# Patient Record
Sex: Female | Born: 1955 | Hispanic: No | Marital: Married | Smoking: Never smoker
Health system: Southern US, Community
[De-identification: ages and names within clinical notes are randomized; demographics above are authoritative.]

## PROBLEM LIST (undated history)

## (undated) DIAGNOSIS — N870 Mild cervical dysplasia: Secondary | ICD-10-CM

## (undated) DIAGNOSIS — E042 Nontoxic multinodular goiter: Secondary | ICD-10-CM

## (undated) DIAGNOSIS — F419 Anxiety disorder, unspecified: Secondary | ICD-10-CM

## (undated) DIAGNOSIS — E039 Hypothyroidism, unspecified: Secondary | ICD-10-CM

## (undated) HISTORY — DX: Anxiety disorder, unspecified: F41.9

## (undated) HISTORY — DX: Nontoxic multinodular goiter: E04.2

## (undated) HISTORY — DX: Hypothyroidism, unspecified: E03.9

## (undated) HISTORY — PX: COLPOSCOPY: SHX161

## (undated) HISTORY — DX: Mild cervical dysplasia: N87.0

## (undated) HISTORY — PX: TUBAL LIGATION: SHX77

---

## 1992-10-07 HISTORY — PX: CARPAL TUNNEL RELEASE: SHX101

## 2003-10-08 HISTORY — PX: THYROID SURGERY: SHX805

## 2009-03-30 ENCOUNTER — Ambulatory Visit: Payer: Self-pay | Admitting: Obstetrics and Gynecology

## 2009-04-07 ENCOUNTER — Ambulatory Visit: Payer: Self-pay | Admitting: Obstetrics and Gynecology

## 2009-06-28 ENCOUNTER — Ambulatory Visit: Payer: Self-pay | Admitting: Obstetrics and Gynecology

## 2009-09-15 ENCOUNTER — Ambulatory Visit: Payer: Self-pay | Admitting: Obstetrics and Gynecology

## 2009-09-15 ENCOUNTER — Other Ambulatory Visit: Admission: RE | Admit: 2009-09-15 | Discharge: 2009-09-15 | Payer: Self-pay | Admitting: Obstetrics and Gynecology

## 2010-09-19 ENCOUNTER — Other Ambulatory Visit
Admission: RE | Admit: 2010-09-19 | Discharge: 2010-09-19 | Payer: Self-pay | Source: Home / Self Care | Admitting: Obstetrics and Gynecology

## 2010-09-19 ENCOUNTER — Ambulatory Visit: Payer: Self-pay | Admitting: Obstetrics and Gynecology

## 2011-03-21 ENCOUNTER — Ambulatory Visit (INDEPENDENT_AMBULATORY_CARE_PROVIDER_SITE_OTHER): Payer: BC Managed Care – PPO | Admitting: Obstetrics and Gynecology

## 2011-03-21 DIAGNOSIS — N951 Menopausal and female climacteric states: Secondary | ICD-10-CM

## 2011-03-21 DIAGNOSIS — N920 Excessive and frequent menstruation with regular cycle: Secondary | ICD-10-CM

## 2011-05-13 ENCOUNTER — Telehealth: Payer: Self-pay

## 2011-05-13 DIAGNOSIS — Z780 Asymptomatic menopausal state: Secondary | ICD-10-CM

## 2011-05-13 MED ORDER — DROSPIRENONE-ESTRADIOL 0.5-1 MG PO TABS: 1.0000 | ORAL_TABLET | Freq: Every day | ORAL | Status: DC

## 2011-05-13 NOTE — Telephone Encounter (Signed)
The patient called back today. Prior to initiating HRT she was having mild abdominal bloating. She has now been on Enjuvia and the abdominal bloating has gotten much worse. We lowered the dose of Enjuvia and it's not any better. She is also having vaginal dryness. She was on estrogen cream the report for the epic she stopped it when she started the systemic HRT. She had read about Angeliq did she want to try that. We have already discussed that menopause can cause some weight gain which is another issue for her. We'll we decided to do today was switch her to angelic 1 mg. She knows to stop her progesterone since she is already on progesterone with angelic. If the vaginal dryness persists she'll add back her estrogen cream. I have also asked her to schedule pelvic ultrasound. Her medication was called in.

## 2011-05-13 NOTE — Telephone Encounter (Signed)
DR. Eda Paschal LEFT MESSAGE TO TALK TO PT. PT CALLED BACK & I GOTHER ON THE PHONE FOR HIM.

## 2011-05-13 NOTE — Telephone Encounter (Signed)
SEE NURSE NOTE IN CHART ON 04/25/11. PT. C-O OF SAME SYMPTOMS ON LOWER DOSE OF ENJUVIA 0.9. HAS HEARD ABOUT AN RX ANGELIQ & IS INTERESTED IN IT IF YOU THINK IT IS A GOOD ONE TO TRY. SHE IS ALSO OK WITH YOU SUGGESTING A DIFFERENT RX IF THERE IS ONE BETTER TO TRY INSTEAD OF THE ANGELIQ.

## 2011-05-14 ENCOUNTER — Encounter: Payer: Self-pay | Admitting: Obstetrics and Gynecology

## 2011-06-11 ENCOUNTER — Ambulatory Visit (INDEPENDENT_AMBULATORY_CARE_PROVIDER_SITE_OTHER): Payer: BC Managed Care – PPO | Admitting: Obstetrics and Gynecology

## 2011-06-11 ENCOUNTER — Other Ambulatory Visit: Payer: Self-pay

## 2011-06-11 ENCOUNTER — Other Ambulatory Visit: Payer: BC Managed Care – PPO

## 2011-06-11 DIAGNOSIS — D251 Intramural leiomyoma of uterus: Secondary | ICD-10-CM

## 2011-06-11 DIAGNOSIS — D259 Leiomyoma of uterus, unspecified: Secondary | ICD-10-CM

## 2011-06-11 DIAGNOSIS — R141 Gas pain: Secondary | ICD-10-CM

## 2011-06-11 DIAGNOSIS — R143 Flatulence: Secondary | ICD-10-CM

## 2011-06-11 DIAGNOSIS — N83339 Acquired atrophy of ovary and fallopian tube, unspecified side: Secondary | ICD-10-CM

## 2011-06-11 DIAGNOSIS — Z780 Asymptomatic menopausal state: Secondary | ICD-10-CM

## 2011-06-11 DIAGNOSIS — N951 Menopausal and female climacteric states: Secondary | ICD-10-CM

## 2011-06-11 NOTE — Progress Notes (Signed)
The patient came in today for a ultrasound because of abdominal bloating. 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 She has several small myomas of less than 2 cm. Her endometrial echo is 5.8 mm consistent with her HRT. Both her ovaries are atrophic. Her cul-de-sac is free of fluid. She says she's doing just as well on Angeliq as she did on Enjuvia with less fluid retention. I have explained her that Angeliq's progestin is a diuretic and that is probably why. She was reassured about the ultrasound. She will continue on Angeliq. She wonders a summary of the side effects she is having is from her Ambien. I switched today Lunesta 3 mg #30 with 2 refills. We discussed referral to a sleep specialist if this doesn't work.

## 2011-07-05 ENCOUNTER — Telehealth: Payer: Self-pay | Admitting: *Deleted

## 2011-07-05 NOTE — Telephone Encounter (Signed)
Pt wanted a referral to Dr Vickey Huger for her sleep issues. She states you and her had talked a little about it at her last visit. You gave her Alfonso Patten which is helping a little bit but is expensive, so I sent her a coupon. She wants to proceed with seeing Dr Vickey Huger. Patient knows Dr Joanna Hews here and we will get back to her when he returns. PLease advise.

## 2011-07-06 NOTE — Telephone Encounter (Signed)
Please refer to Dr. Vickey Huger

## 2011-07-08 NOTE — Telephone Encounter (Signed)
Pt knows will hear from our referral coordinator

## 2011-07-11 ENCOUNTER — Encounter: Payer: Self-pay | Admitting: Obstetrics and Gynecology

## 2011-08-05 ENCOUNTER — Encounter: Payer: Self-pay | Admitting: Obstetrics and Gynecology

## 2011-09-04 ENCOUNTER — Other Ambulatory Visit: Payer: Self-pay | Admitting: Endocrinology

## 2011-09-04 DIAGNOSIS — E049 Nontoxic goiter, unspecified: Secondary | ICD-10-CM

## 2011-09-10 ENCOUNTER — Other Ambulatory Visit: Payer: BC Managed Care – PPO

## 2011-09-10 ENCOUNTER — Other Ambulatory Visit: Payer: Self-pay | Admitting: Obstetrics and Gynecology

## 2011-09-10 NOTE — Telephone Encounter (Signed)
Patient has RGCE scheduled 09/25/11.

## 2011-09-25 ENCOUNTER — Other Ambulatory Visit (HOSPITAL_COMMUNITY)
Admission: RE | Admit: 2011-09-25 | Discharge: 2011-09-25 | Disposition: A | Discharge status: Home / Self Care | Payer: BC Managed Care – PPO | Source: Ambulatory Visit | Attending: Obstetrics and Gynecology | Admitting: Obstetrics and Gynecology

## 2011-09-25 ENCOUNTER — Encounter: Payer: Self-pay | Admitting: Obstetrics and Gynecology

## 2011-09-25 ENCOUNTER — Ambulatory Visit (INDEPENDENT_AMBULATORY_CARE_PROVIDER_SITE_OTHER): Payer: BC Managed Care – PPO | Admitting: Obstetrics and Gynecology

## 2011-09-25 VITALS — BP 124/76 | Ht 61.0 in | Wt 120.0 lb

## 2011-09-25 DIAGNOSIS — Z780 Asymptomatic menopausal state: Secondary | ICD-10-CM

## 2011-09-25 DIAGNOSIS — Z01419 Encounter for gynecological examination (general) (routine) without abnormal findings: Secondary | ICD-10-CM | POA: Insufficient documentation

## 2011-09-25 DIAGNOSIS — N951 Menopausal and female climacteric states: Secondary | ICD-10-CM

## 2011-09-25 DIAGNOSIS — N870 Mild cervical dysplasia: Secondary | ICD-10-CM | POA: Insufficient documentation

## 2011-09-25 DIAGNOSIS — R823 Hemoglobinuria: Secondary | ICD-10-CM

## 2011-09-25 MED ORDER — DROSPIRENONE-ESTRADIOL 0.5-1 MG PO TABS: 1.0000 | ORAL_TABLET | Freq: Every day | ORAL | Status: AC

## 2011-09-25 MED ORDER — ESZOPICLONE 3 MG PO TABS: 3.0000 mg | ORAL_TABLET | Freq: Every day | ORAL | Status: DC

## 2011-09-25 NOTE — Progress Notes (Signed)
Patient came to see me today for her annual GYN exam. She is currently on Angelique with complete control of her vasomotor symptoms. She does say that she gets agitated for several hours after taking it. She takes it at dinnertime and has some trouble fallen asleep. She is now on [place] rather than [place]  which she likes better. She is having no bleeding or pelvic pain. She is up-to-date on mammograms and bone densities. She does her lab work elsewhere.  Physical examination:  Kennon Portela present. HEENT within normal limits. Neck: Thyroid not large. No masses. Supraclavicular nodes: not enlarged. Breasts: Examined in both sitting midline position. No skin changes and no masses. Abdomen: Soft no guarding rebound or masses or hernia. Pelvic: External: Within normal limits. BUS: Within normal limits. Vaginal:within normal limits. Good estrogen effect. No evidence of cystocele rectocele or enterocele. Cervix: clean. Uterus: Normal size and shape. Adnexa: No masses. Rectovaginal exam: Confirmatory and negative. Extremities: Within normal limits.  Assessment: Menopausal symptoms  Plan: For the moment continue Angelique but take it in the morning. Also discussed CombiPatch for [place] pro which she may want to try based on her response to the above plus the cost. She will inform. She will continue yearly mammograms. We refilled her Alfonso Patten they as well.

## 2011-09-27 ENCOUNTER — Other Ambulatory Visit: Payer: Self-pay | Admitting: *Deleted

## 2011-10-03 ENCOUNTER — Encounter: Payer: BC Managed Care – PPO | Admitting: Obstetrics and Gynecology

## 2011-10-09 ENCOUNTER — Telehealth: Payer: Self-pay | Admitting: *Deleted

## 2011-10-09 NOTE — Telephone Encounter (Signed)
Pt is complaining of spotting since December 18th, pt had annual on dec.19 and spoke with you about this. On December 25 th bleeding became more heavy along with cramping. She take HRT Angelique 0.5-1 mg tablet daily changes pads every couple of every hours. Pt takes ibuprofen for cramping. She wants to know if the HRT could cause bleeding? Please advise

## 2011-10-09 NOTE — Telephone Encounter (Signed)
I think the bleeding is due to her HRT. However I want to be sure so I would like her to come in for endometrial biopsy.

## 2011-10-09 NOTE — Telephone Encounter (Signed)
Patient informed. appts to schedule.

## 2011-10-10 ENCOUNTER — Encounter: Payer: Self-pay | Admitting: Obstetrics and Gynecology

## 2011-10-10 ENCOUNTER — Ambulatory Visit (INDEPENDENT_AMBULATORY_CARE_PROVIDER_SITE_OTHER): Payer: BC Managed Care – PPO | Admitting: Obstetrics and Gynecology

## 2011-10-10 VITALS — BP 150/90

## 2011-10-10 DIAGNOSIS — N950 Postmenopausal bleeding: Secondary | ICD-10-CM

## 2011-10-10 NOTE — Progress Notes (Signed)
Patient came to see me today because of persistent postmenopausal bleeding. We had switched her to Angelique in August. She had no bleeding until mid-December. It started spotting but over the past several weeks it's gotten heavier. We have done a pelvic ultrasound on her in September, 2012 for pelvic pain. Her endometrial echo then was 5.8 mm. She had several small fibroids without any ovarian pathology.  Pelvic exam: Kennon Portela present. External within normal limits. Vaginal exam within normal limits. Cervix clean with small amount of blood coming from os. Uterus normal size and shape. Adnexa within normal limits. Patient had 2 areas of folliculitis on the top of her mons.  Assessment: Postmenopausal bleeding on HRT.  Plan: Endometrial biopsy done. We will call patient with report. She will continue Angelique. If bleeding persists for another month we will proceed with SI[facility]. Patient to use warm compresses to area of folliculitis. They did not appear infected today.

## 2011-10-14 ENCOUNTER — Other Ambulatory Visit: Payer: Self-pay | Admitting: Endocrinology

## 2011-10-14 DIAGNOSIS — E041 Nontoxic single thyroid nodule: Secondary | ICD-10-CM

## 2011-10-16 ENCOUNTER — Encounter: Payer: BC Managed Care – PPO | Admitting: Obstetrics and Gynecology

## 2011-10-17 ENCOUNTER — Other Ambulatory Visit: Payer: BC Managed Care – PPO

## 2011-10-23 ENCOUNTER — Ambulatory Visit
Admission: RE | Admit: 2011-10-23 | Discharge: 2011-10-23 | Disposition: A | Discharge status: Home / Self Care | Payer: BC Managed Care – PPO | Source: Ambulatory Visit | Attending: Endocrinology | Admitting: Endocrinology

## 2011-10-23 DIAGNOSIS — E049 Nontoxic goiter, unspecified: Secondary | ICD-10-CM

## 2011-11-05 ENCOUNTER — Other Ambulatory Visit: Payer: Self-pay | Admitting: Endocrinology

## 2011-11-05 DIAGNOSIS — E041 Nontoxic single thyroid nodule: Secondary | ICD-10-CM

## 2011-12-06 ENCOUNTER — Telehealth: Payer: Self-pay | Admitting: *Deleted

## 2011-12-06 NOTE — Telephone Encounter (Signed)
Schedule saline infusion hystogram for me to do next week in office. Explain to pt what procedure is.

## 2011-12-06 NOTE — Telephone Encounter (Signed)
(  Pt is aware you are out of office) pt is currently on Angelique 0.5-1 mg pt was told to follow up with you if bleeding should occur again. Pt started having bleeding on wed. 12/04/11 not heavy, but enough to wear mini pad per pt. It is red blood, not brown she also noted having some cramping with bleeding as well. Please advise

## 2011-12-09 ENCOUNTER — Other Ambulatory Visit: Payer: Self-pay | Admitting: *Deleted

## 2011-12-09 DIAGNOSIS — N926 Irregular menstruation, unspecified: Secondary | ICD-10-CM

## 2011-12-09 NOTE — Telephone Encounter (Signed)
Left message on pt vm to call

## 2011-12-09 NOTE — Telephone Encounter (Signed)
Pt informed with the below note, pt will make appointment. 

## 2012-01-08 ENCOUNTER — Ambulatory Visit (INDEPENDENT_AMBULATORY_CARE_PROVIDER_SITE_OTHER): Payer: BC Managed Care – PPO

## 2012-01-08 ENCOUNTER — Ambulatory Visit (INDEPENDENT_AMBULATORY_CARE_PROVIDER_SITE_OTHER): Payer: BC Managed Care – PPO | Admitting: Obstetrics and Gynecology

## 2012-01-08 ENCOUNTER — Other Ambulatory Visit: Payer: Self-pay | Admitting: Obstetrics and Gynecology

## 2012-01-08 DIAGNOSIS — N950 Postmenopausal bleeding: Secondary | ICD-10-CM

## 2012-01-08 DIAGNOSIS — D259 Leiomyoma of uterus, unspecified: Secondary | ICD-10-CM

## 2012-01-08 DIAGNOSIS — N926 Irregular menstruation, unspecified: Secondary | ICD-10-CM

## 2012-01-08 DIAGNOSIS — D251 Intramural leiomyoma of uterus: Secondary | ICD-10-CM

## 2012-01-08 DIAGNOSIS — N83339 Acquired atrophy of ovary and fallopian tube, unspecified side: Secondary | ICD-10-CM

## 2012-01-08 DIAGNOSIS — D219 Benign neoplasm of connective and other soft tissue, unspecified: Secondary | ICD-10-CM

## 2012-01-08 NOTE — Progress Notes (Signed)
Patient came back today for a saline infusion histogram. She is having an appropriate bleeding on Angelique. She is very happy with Angelique except for the bleeding. It can be as much as 10-11 days a month. She had a normal endometrial biopsy when I last saw her. On ultrasound today she has a uterus with 2 small fibroids. One encroaches on the endometrial cavity. Her endometrial echo is 5.5 mm. Her right and left ovary are normal and atrophic. Her cul-de-sac is free of fluid. A catheter was placed in the uterus. Saline was introduced. Patient has a normal endometrial cavity.  Assessment: Inappropriate postmenopausal bleeding  Plan: We had a very long discussion about what the next step should be. For the moment she will do Angelique. She will do it the first 25 days of  the month. She knows to expect withdrawal bleeding. Other options discussed with her her endometrial ablation, Mirena IUD, or cyclical progesterone. She will keep me informed.

## 2012-02-19 ENCOUNTER — Telehealth: Payer: Self-pay | Admitting: *Deleted

## 2012-02-19 NOTE — Telephone Encounter (Signed)
Pt is calling to follow up regarding last OV 01/08/12 pt took her Angelique first 25 days of the month and started taking angelique again on  1st of may as directed. Pt said on may 5 th she had spotting that continued thru Saturday and then heavy bleeding has continued. She is changing pads every 2 hours and having cramping and heavy bleeding, pt said yesterday she felt horrible, this will be her first time taking angelique for the first 25 days only. Pt would like to know what she should do? Please advise

## 2012-02-19 NOTE — Telephone Encounter (Signed)
I called the patient and told her to stop her Angelique. I told her that would stop the bleeding. We discussed other options. They include a Mirena IUD, endometrial ablation, or trying a different product such as Activella or Prempro. She will decide. If she becomes symptomatic will she is deciding we will give her estrogen alone for short period of time.

## 2012-02-27 ENCOUNTER — Ambulatory Visit (INDEPENDENT_AMBULATORY_CARE_PROVIDER_SITE_OTHER): Payer: BC Managed Care – PPO | Admitting: Obstetrics and Gynecology

## 2012-02-27 DIAGNOSIS — Z780 Asymptomatic menopausal state: Secondary | ICD-10-CM

## 2012-02-27 DIAGNOSIS — N951 Menopausal and female climacteric states: Secondary | ICD-10-CM

## 2012-02-27 DIAGNOSIS — N950 Postmenopausal bleeding: Secondary | ICD-10-CM

## 2012-02-27 NOTE — Progress Notes (Signed)
Patient came back today to discuss options for hormone replacement therapy due  to her postmenopausal bleeding. We have assessed the bleeding with saline infusion histogram a normal endometrial biopsy. She does have 2 small intramural fibroids. When we talked last week we suggested she stop the Angelique and she has stopped bleeding now. So far she is asymptomatic. We obviously will not reinitiate treatment unless she becomes symptomatic although I suspect she will. We have previously discussed a Mirena IUD or endometrial ablation but for the moment she would like to not do either. We discussed switching to a different combinations product such as Prempro or Activella. She had previously tried Activella with Dr. Nicholas Lose and experienced significant weight gain. We also discussed and estrogen patch with Prometrium. We discussed at the lowest dose of estrogen that would relieve her symptoms would be the most likely to prevent bleeding. We lastly discussed every 3 months progestin or unopposed estrogen with endometrial biopsies. We discussed all the above over 30 minutes. She will let me know if she needs to reinitiate treatment. We were leaning towards a very low dose Vivelle dot patch of either 0.025  Or .0375 along with continuous Prometrium. We discussed that she does not need systemic estrogen she will probably need vaginal estrogen for intercourse.

## 2012-04-07 ENCOUNTER — Other Ambulatory Visit: Payer: Self-pay | Admitting: Obstetrics and Gynecology

## 2012-04-08 NOTE — Telephone Encounter (Signed)
rx called in

## 2012-04-13 ENCOUNTER — Other Ambulatory Visit: Payer: BC Managed Care – PPO

## 2012-04-16 ENCOUNTER — Telehealth: Payer: Self-pay | Admitting: *Deleted

## 2012-04-16 NOTE — Telephone Encounter (Signed)
Vagifem 10 MCG suppositories. 1 at bedtime for 2 weeks and vagina and then use twice a week.

## 2012-04-16 NOTE — Telephone Encounter (Signed)
Pt calling requesting medication for vaginal dryness, she was told to follow up when ready to proceed with this. Pt said she has used cream sometime ago and it was messy. Please advise

## 2012-04-17 MED ORDER — ESTRADIOL 10 MCG VA TABS: ORAL_TABLET | VAGINAL | Status: AC

## 2012-04-17 NOTE — Telephone Encounter (Signed)
Pt informed with the below note, order placed. 

## 2012-04-17 NOTE — Addendum Note (Signed)
Addended by: Aura Camps on: 04/17/2012 08:35 AM   Modules accepted: Orders

## 2012-05-22 ENCOUNTER — Encounter: Payer: Self-pay | Admitting: Obstetrics and Gynecology

## 2012-05-26 ENCOUNTER — Telehealth: Payer: Self-pay | Admitting: *Deleted

## 2012-05-26 NOTE — Telephone Encounter (Signed)
[facility] request additional views from pt mammogram report. Diag.mammogram of left breast and ultrasound if need of left. Please sign order and I will fax to them.

## 2012-05-26 NOTE — Telephone Encounter (Signed)
Order signed by Dr.G and will be faxed to [facility] @ [phone] office # [phone]

## 2012-05-27 ENCOUNTER — Encounter: Payer: Self-pay | Admitting: Obstetrics and Gynecology

## 2012-06-10 ENCOUNTER — Other Ambulatory Visit: Payer: Self-pay | Admitting: Obstetrics and Gynecology

## 2012-06-15 ENCOUNTER — Other Ambulatory Visit: Payer: Self-pay | Admitting: Obstetrics and Gynecology

## 2012-06-16 ENCOUNTER — Other Ambulatory Visit: Payer: Self-pay | Admitting: Obstetrics and Gynecology

## 2012-06-16 NOTE — Telephone Encounter (Signed)
Dr Reece Agar did a refill on 9/4 on this RX but it didn't reach the pharmacy. It printed. Therefore I called the RX into her pharmacy today. KW

## 2012-06-17 ENCOUNTER — Telehealth: Payer: Self-pay | Admitting: *Deleted

## 2012-06-17 NOTE — Telephone Encounter (Signed)
rx called in

## 2012-06-17 NOTE — Telephone Encounter (Signed)
Error

## 2012-12-15 ENCOUNTER — Telehealth: Payer: Self-pay | Admitting: *Deleted

## 2012-12-15 NOTE — Telephone Encounter (Signed)
Order faxed for 6 month follow up for diag. Mammogram and ultrasound of left breast. Faxed to [facility] in [place] [phone]

## 2012-12-31 ENCOUNTER — Encounter: Payer: Self-pay | Admitting: Gynecology

## 2013-07-17 IMAGING — US US SOFT TISSUE HEAD/NECK
1 series · 14 of 25 positions shown · non-contrast
Comparison: None.

CLINICAL DATA: History of goiter and thyroid nodules.  History of
thyroid cyst removal in 5558.

THYROID ULTRASOUND
TECHNIQUE: Ultrasound examination of the thyroid gland and adjacent
soft tissues was performed.

[Series 1: us soft tissue head/neck · 0.08mm/px · 14 of 60 slices shown]
[im 1/60]
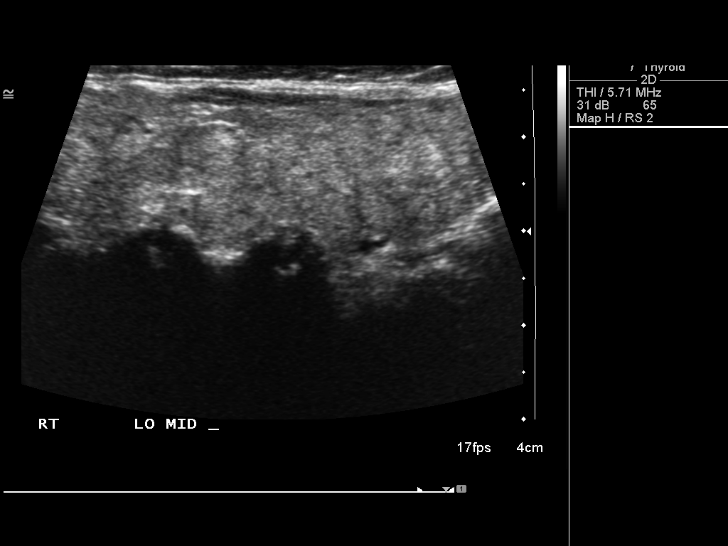
[im 5/60]
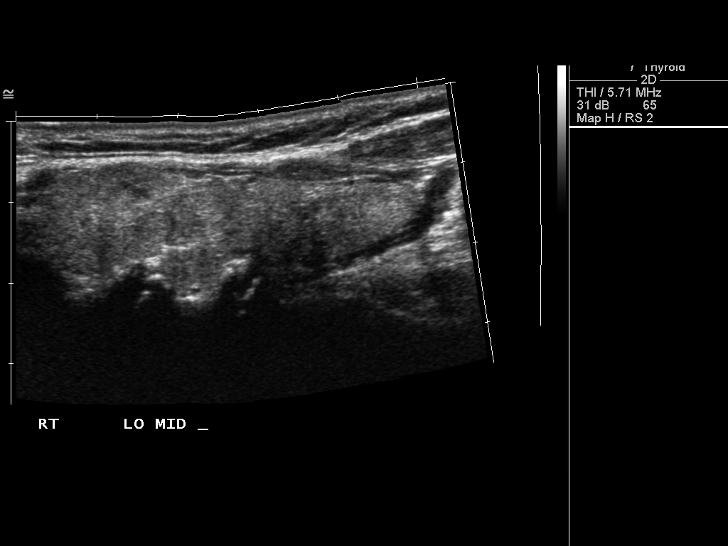
[im 10/60]
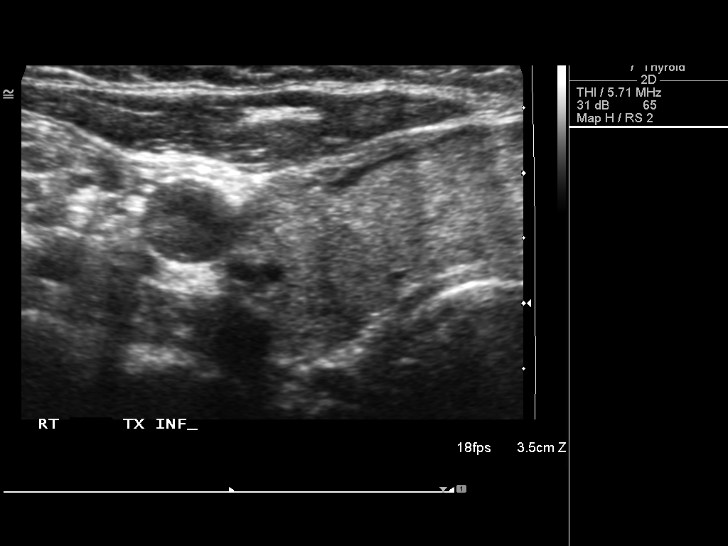
[im 15/60]
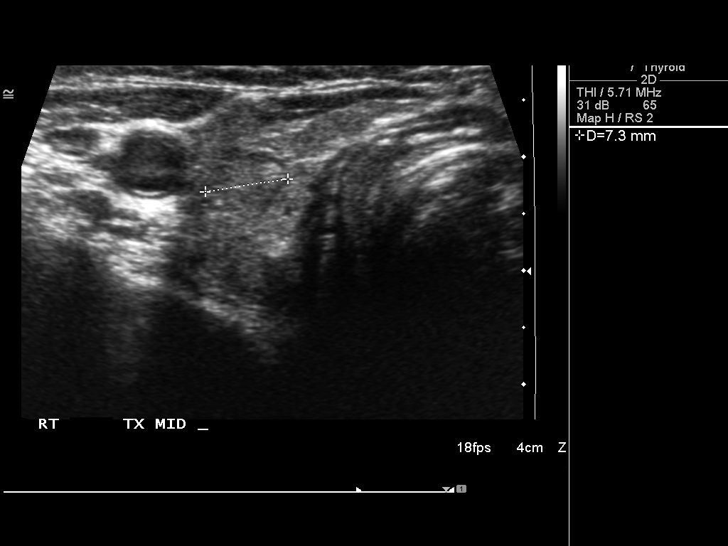
[im 20/60]
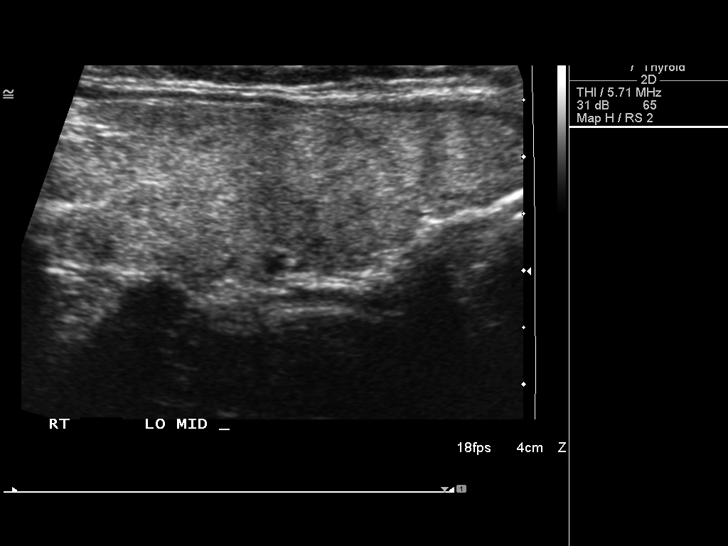
[im 23/60]
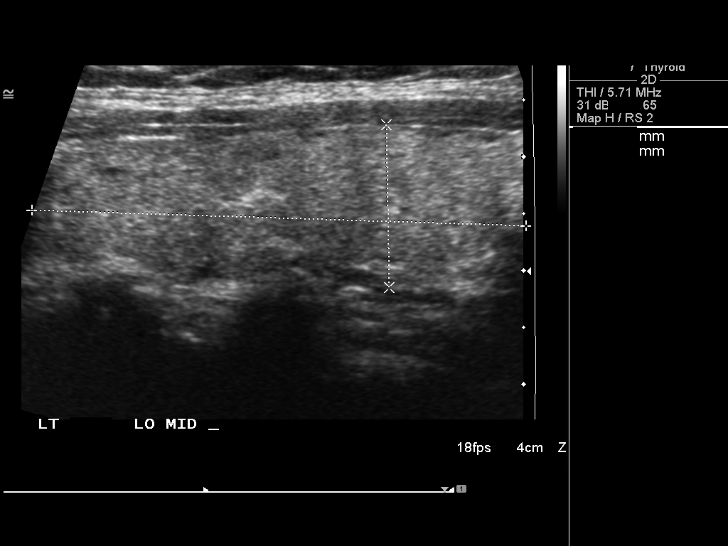
[im 28/60]
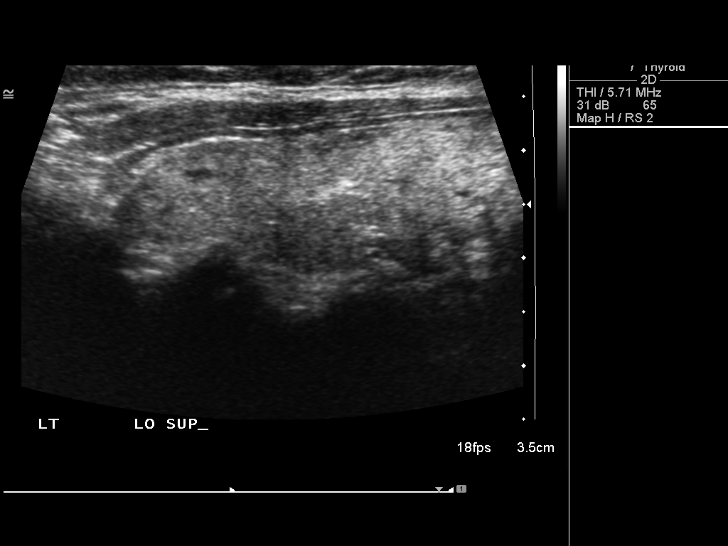
[im 32/60]
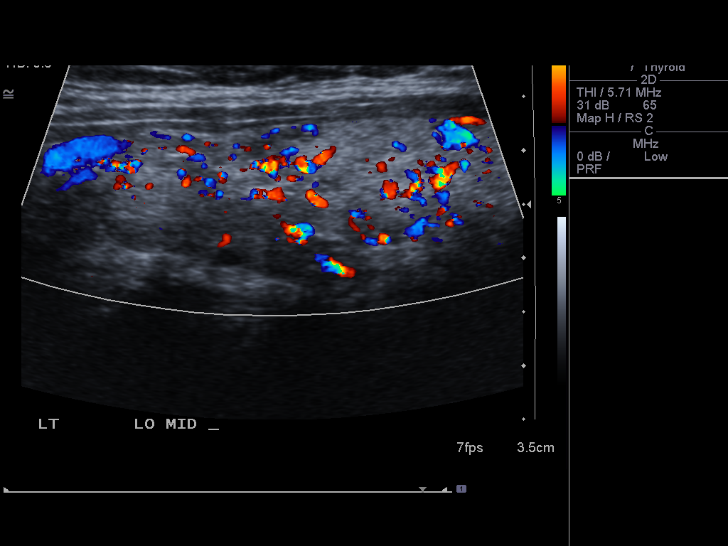
[im 37/60]
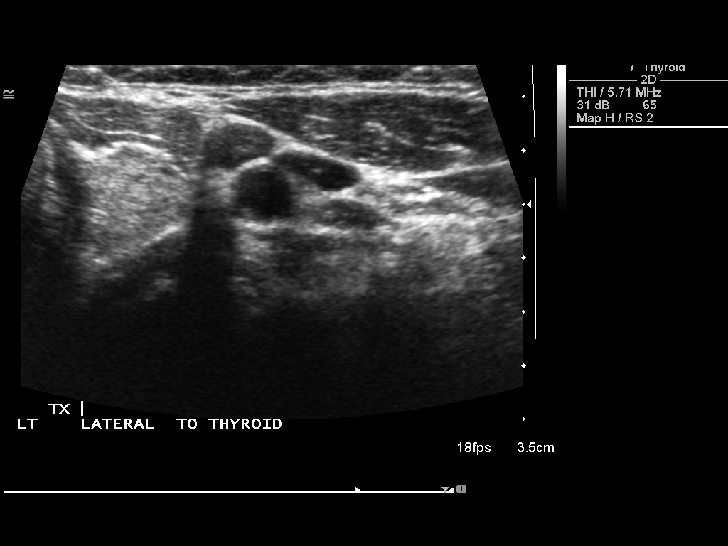
[im 40/60]
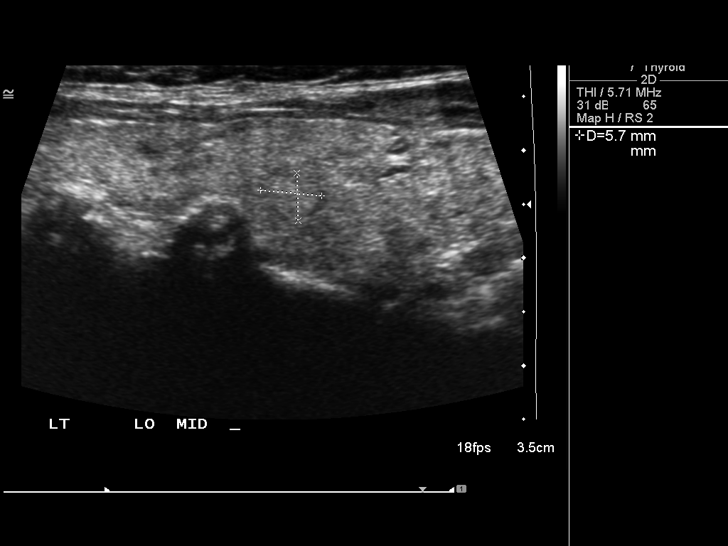
[im 45/60]
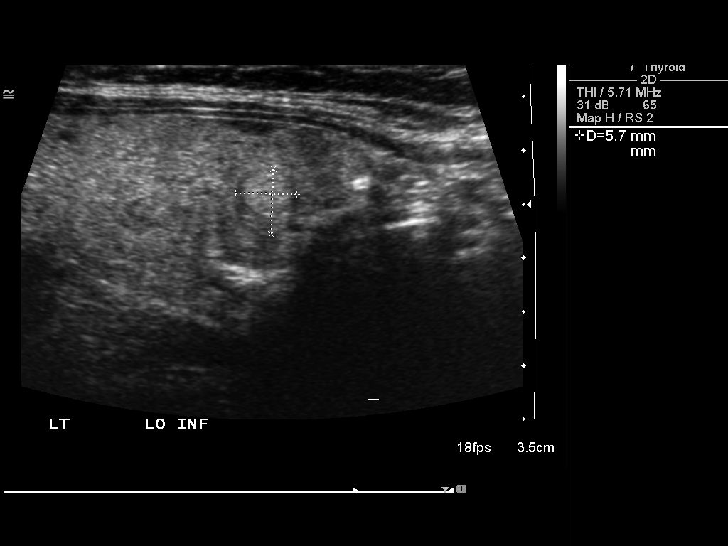
[im 50/60]
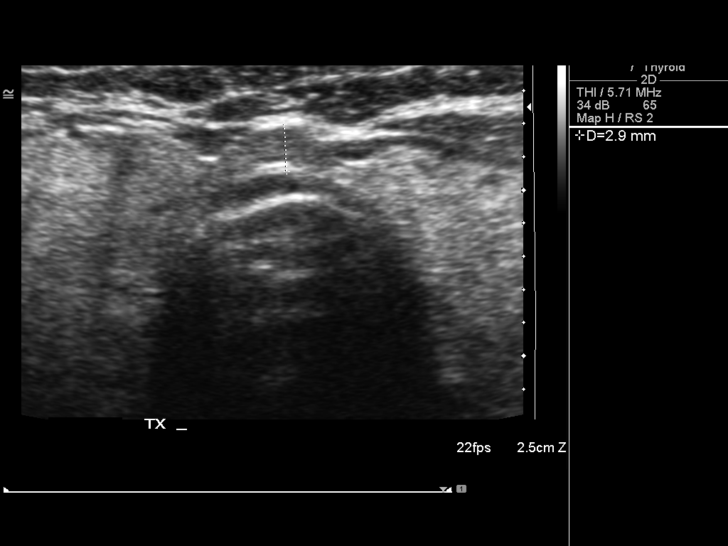
[im 55/60]
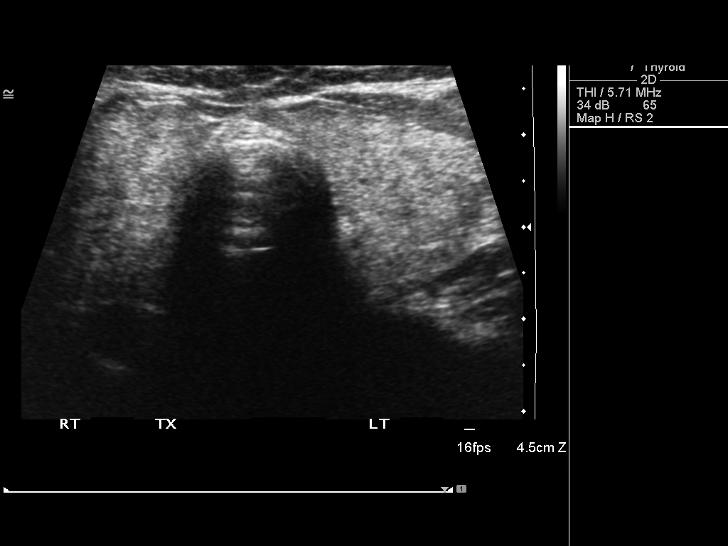
[im 60/60]
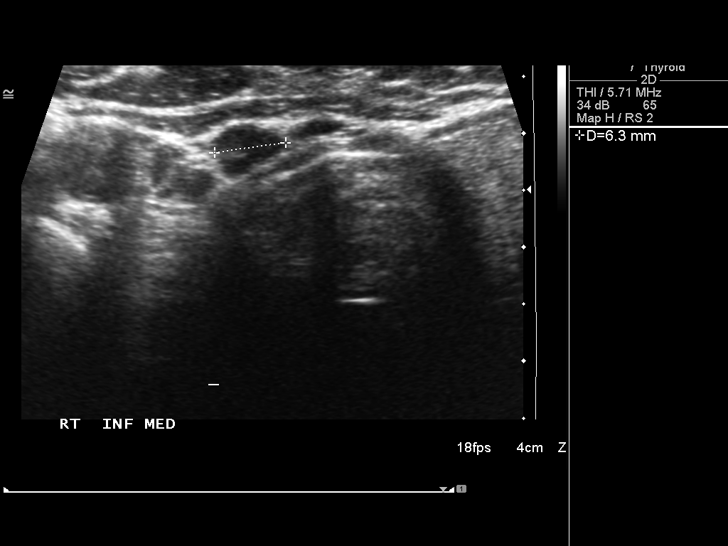

[14 of 25 positions shown; findings below may reference images not displayed]

FINDINGS: Right thyroid lobe:  4.9 x 1.7 x 1.8 cm.
Left thyroid lobe:  5.2 x 1.6 x 1.9 cm.
Isthmus:  0.3 cm in thickness.

Focal nodules:  The gland is diffusely heterogeneous in echotexture
and mildly nodular in contour.  Scattered small nodules are present
bilaterally.  The largest nodule on the right is located in the mid
lobe and measures 13 x 6 x 7 mm.  There are two mildly echogenic
lesions in the left lobe, each measuring 6 mm maximally.  No
dominant or otherwise suspicious nodule is seen.  Just inferior to
the right lobe is a 6 mm hypoechoic nodule which may reflect a
normal-sized lymph node.

Lymphadenopathy:  None visualized.
IMPRESSION: Diffuse thyroid heterogeneity with small nodules bilaterally as
described.  No dominant or otherwise suspicious nodule identified.
Suggest correlation with prior imaging studies.

## 2014-08-08 ENCOUNTER — Encounter: Payer: Self-pay | Admitting: Obstetrics and Gynecology

## 2019-12-10 ENCOUNTER — Ambulatory Visit: Payer: Self-pay | Attending: Internal Medicine

## 2019-12-10 DIAGNOSIS — Z23 Encounter for immunization: Secondary | ICD-10-CM | POA: Insufficient documentation

## 2019-12-10 NOTE — Progress Notes (Signed)
   Covid-19 Vaccination Clinic  Name:  Deb Morici    MRN: [id number] DOB: 09/25/1956  12/10/2019  Ms. Ruggles was observed post Covid-19 immunization for 15 minutes without incident. She was provided with Vaccine Information Sheet and instruction to access the V-Safe system.   Ms. Goughnour was instructed to call 911 with any severe reactions post vaccine: Marland Kitchen Difficulty breathing  . Swelling of face and throat  . A fast heartbeat  . A bad rash all over body  . Dizziness and weakness

## 2020-01-05 ENCOUNTER — Ambulatory Visit: Payer: Self-pay | Attending: Internal Medicine

## 2020-01-05 DIAGNOSIS — Z23 Encounter for immunization: Secondary | ICD-10-CM

## 2020-01-05 NOTE — Progress Notes (Signed)
   Covid-19 Vaccination Clinic  Name:  Debra Turner    MRN: [id number] DOB: [birth year]  01/05/2020  [name] was observed post Covid-19 immunization for 15 minutes without incident. She was provided with Vaccine Information Sheet and instruction to access the V-Safe system.   [name] was instructed to call 911 with any severe reactions post vaccine: Marland Kitchen Difficulty breathing  . Swelling of face and throat  . A fast heartbeat  . A bad rash all over body  . Dizziness and weakness   Immunizations Administered    Name Date Dose VIS Date Route   Pfizer COVID-19 Vaccine 01/05/2020  3:35 PM 0.3 mL 09/17/2019 Intramuscular   Manufacturer: [place]   Lot: [id number]   [place]: [id number]
